# Patient Record
Sex: Female | Born: 1950 | Race: White | Hispanic: No | Marital: Married | Smoking: Former smoker
Health system: Southern US, Community
[De-identification: ages and names within clinical notes are randomized; demographics above are authoritative.]

## PROBLEM LIST (undated history)

## (undated) DIAGNOSIS — K760 Fatty (change of) liver, not elsewhere classified: Secondary | ICD-10-CM

## (undated) DIAGNOSIS — K219 Gastro-esophageal reflux disease without esophagitis: Secondary | ICD-10-CM

## (undated) DIAGNOSIS — E669 Obesity, unspecified: Secondary | ICD-10-CM

## (undated) DIAGNOSIS — R000 Tachycardia, unspecified: Secondary | ICD-10-CM

## (undated) DIAGNOSIS — F419 Anxiety disorder, unspecified: Secondary | ICD-10-CM

## (undated) HISTORY — PX: CERVICAL SPINE SURGERY: SHX589

## (undated) HISTORY — PX: ABDOMINAL HYSTERECTOMY: SHX81

## (undated) HISTORY — PX: ANKLE SURGERY: SHX546

## (undated) HISTORY — PX: HAND SURGERY: SHX662

## (undated) HISTORY — PX: APPENDECTOMY: SHX54

## (undated) HISTORY — PX: CHOLECYSTECTOMY: SHX55

---

## 2015-05-24 ENCOUNTER — Emergency Department (INDEPENDENT_AMBULATORY_CARE_PROVIDER_SITE_OTHER)
Admission: EM | Admit: 2015-05-24 | Discharge: 2015-05-24 | Disposition: A | Discharge status: Home / Self Care | Payer: Managed Care, Other (non HMO) | Source: Home / Self Care | Attending: Family Medicine | Admitting: Family Medicine

## 2015-05-24 ENCOUNTER — Encounter: Payer: Self-pay | Admitting: Emergency Medicine

## 2015-05-24 DIAGNOSIS — H9203 Otalgia, bilateral: Secondary | ICD-10-CM | POA: Diagnosis not present

## 2015-05-24 DIAGNOSIS — R0981 Nasal congestion: Secondary | ICD-10-CM

## 2015-05-24 HISTORY — DX: Fatty (change of) liver, not elsewhere classified: K76.0

## 2015-05-24 NOTE — Discharge Instructions (Signed)
You may use your allergy nasal spray to help keep your sinus clear.  You may also find relief by using over the counter sinus rinses. Ask your pharmacist to show you your different options for sinus rinses.  You may take tylenol and ibuprofen for fever or pain.  Follow up with your primary care provider in 1 week if symptoms not improving, sooner if worsening. See below for further instructions.

## 2015-05-24 NOTE — ED Provider Notes (Signed)
CSN: [id number]     Arrival date & time 05/24/15  1632 History   First MD Initiated Contact with Patient 05/24/15 1642     Chief Complaint  Patient presents with  . Otalgia   (Consider location/radiation/quality/duration/timing/severity/associated sxs/prior Treatment) HPI  The patient is a 64 year old female presenting to urgent care with complaints of bilateral ear pain and pressure that started last night.  Patient also reports decreased hearing in left ear.  Stating it feels "full."  Reports mild nasal congestion.  Ear pain is mildly achy and sore.  She did take Zyrtec this morning thinking it was allergies but no relief.  She has not tried any nasal spray, but does state she has an allergy nasal spray at home.  She has not tried any pain medications prior to arrival.  Denies fevers, chills, nausea, vomiting or diarrhea.  Denies cough.  Denies sick contacts or recent travel.  Past Medical History  Diagnosis Date  . Fatty liver    Past Surgical History  Procedure Laterality Date  . Hand surgery    . Ankle surgery     Family History  Problem Relation Age of Onset  . Cancer Father    History  Substance Use Topics  . Smoking status: Former Smoker    Types: Cigarettes  . Smokeless tobacco: Not on file  . Alcohol Use: No   OB History    No data available     Review of Systems  Constitutional: Negative for fever, chills, appetite change and fatigue.  HENT: Positive for congestion, ear pain (bilateral), hearing loss (Left ear) and sinus pressure. Negative for rhinorrhea, sore throat, trouble swallowing and voice change.   Respiratory: Negative for cough and shortness of breath.   Cardiovascular: Negative for chest pain and palpitations.  Gastrointestinal: Negative for nausea, vomiting, abdominal pain and diarrhea.  Neurological: Positive for dizziness and headaches. Negative for light-headedness.    Allergies  Latex; Lovenox; and Codeine  Home Medications   Prior to  Admission medications   Medication Sig Start Date End Date Taking? Authorizing Provider  ALPRAZolam Prudy Feeler) 0.25 MG tablet Take 0.25 mg by mouth at bedtime as needed for anxiety.   Yes Historical Provider, MD  atenolol (TENORMIN) 50 MG tablet Take 50 mg by mouth daily.   Yes Historical Provider, MD  furosemide (LASIX) 10 MG/ML solution Take by mouth daily.   Yes Historical Provider, MD  omeprazole (PRILOSEC) 10 MG capsule Take 10 mg by mouth daily.   Yes Historical Provider, MD   BP 149/75 mmHg  Pulse 68  Temp(Src) 98.1 F (36.7 C) (Oral)  Wt 208 lb (94.348 kg)  SpO2 96% Physical Exam  Constitutional: She appears well-developed and well-nourished. No distress.  HENT:  Head: Normocephalic and atraumatic.  Right Ear: Hearing, tympanic membrane, external ear and ear canal normal.  Left Ear: No mastoid tenderness. Tympanic membrane is not injected, not perforated, not erythematous, not retracted and not bulging. A middle ear effusion is present.  Nose: Mucosal edema present. Right sinus exhibits no maxillary sinus tenderness and no frontal sinus tenderness. Left sinus exhibits no maxillary sinus tenderness and no frontal sinus tenderness.  Mouth/Throat: Uvula is midline, oropharynx is clear and moist and mucous membranes are normal.  Eyes: Conjunctivae are normal. No scleral icterus.  Neck: Normal range of motion. Neck supple.  Cardiovascular: Normal rate, regular rhythm and normal heart sounds.   Pulmonary/Chest: Effort normal and breath sounds normal. No respiratory distress. She has no wheezes. She has no rales.  She exhibits no tenderness.  Abdominal: Soft. She exhibits no distension and no mass. There is no tenderness. There is no rebound and no guarding.  Musculoskeletal: Normal range of motion.  Neurological: She is alert.  Skin: Skin is warm and dry. She is not diaphoretic.  Nursing note and vitals reviewed.   ED Course  Procedures (including critical care time) Labs Review Labs  Reviewed - No data to display  Imaging Review No results found.   MDM   1. Ear pain, bilateral   2. Sinus congestion    Pt c/o bilateral ear pain for 1 day. No fever, chills, n/v/d. Pt does have mild Left middle ear effusion w/o erythema or bulging of TM.  Will tx conservatively with nasal saline rinses, acetaminophen and ibuprofen for pain. F/u with PCP in 1 week if not improving, sooner if worsening. Patient verbalized understanding and agreement with treatment plan.     Junius Finner, PA-C 05/24/15 1739

## 2015-05-24 NOTE — ED Notes (Signed)
Pt c/o bilateral ear pain and pressure x1 day.

## 2018-11-05 ENCOUNTER — Other Ambulatory Visit: Payer: Self-pay

## 2018-11-05 ENCOUNTER — Emergency Department (HOSPITAL_BASED_OUTPATIENT_CLINIC_OR_DEPARTMENT_OTHER)
Admission: EM | Admit: 2018-11-05 | Discharge: 2018-11-05 | Disposition: A | Discharge status: Home / Self Care | Payer: Worker's Compensation | Attending: Emergency Medicine | Admitting: Emergency Medicine

## 2018-11-05 ENCOUNTER — Emergency Department (HOSPITAL_BASED_OUTPATIENT_CLINIC_OR_DEPARTMENT_OTHER): Payer: Worker's Compensation

## 2018-11-05 ENCOUNTER — Encounter (HOSPITAL_BASED_OUTPATIENT_CLINIC_OR_DEPARTMENT_OTHER): Payer: Self-pay | Admitting: Emergency Medicine

## 2018-11-05 DIAGNOSIS — Y92009 Unspecified place in unspecified non-institutional (private) residence as the place of occurrence of the external cause: Secondary | ICD-10-CM

## 2018-11-05 DIAGNOSIS — Y9301 Activity, walking, marching and hiking: Secondary | ICD-10-CM | POA: Diagnosis not present

## 2018-11-05 DIAGNOSIS — Z87891 Personal history of nicotine dependence: Secondary | ICD-10-CM | POA: Insufficient documentation

## 2018-11-05 DIAGNOSIS — Z91040 Latex allergy status: Secondary | ICD-10-CM | POA: Diagnosis not present

## 2018-11-05 DIAGNOSIS — S0181XA Laceration without foreign body of other part of head, initial encounter: Secondary | ICD-10-CM | POA: Diagnosis not present

## 2018-11-05 DIAGNOSIS — Z79899 Other long term (current) drug therapy: Secondary | ICD-10-CM | POA: Insufficient documentation

## 2018-11-05 DIAGNOSIS — S0081XA Abrasion of other part of head, initial encounter: Secondary | ICD-10-CM | POA: Diagnosis not present

## 2018-11-05 DIAGNOSIS — S0083XA Contusion of other part of head, initial encounter: Secondary | ICD-10-CM | POA: Diagnosis not present

## 2018-11-05 DIAGNOSIS — Y999 Unspecified external cause status: Secondary | ICD-10-CM | POA: Diagnosis not present

## 2018-11-05 DIAGNOSIS — W19XXXA Unspecified fall, initial encounter: Secondary | ICD-10-CM

## 2018-11-05 DIAGNOSIS — S0990XA Unspecified injury of head, initial encounter: Secondary | ICD-10-CM | POA: Diagnosis present

## 2018-11-05 DIAGNOSIS — W0110XA Fall on same level from slipping, tripping and stumbling with subsequent striking against unspecified object, initial encounter: Secondary | ICD-10-CM | POA: Diagnosis not present

## 2018-11-05 DIAGNOSIS — Y929 Unspecified place or not applicable: Secondary | ICD-10-CM | POA: Diagnosis not present

## 2018-11-05 HISTORY — DX: Obesity, unspecified: E66.9

## 2018-11-05 HISTORY — DX: Anxiety disorder, unspecified: F41.9

## 2018-11-05 HISTORY — DX: Gastro-esophageal reflux disease without esophagitis: K21.9

## 2018-11-05 HISTORY — DX: Tachycardia, unspecified: R00.0

## 2018-11-05 MED ORDER — HYDROCODONE-ACETAMINOPHEN 5-325 MG PO TABS
2.0000 | ORAL_TABLET | Freq: Once | ORAL | Status: AC
  Administered 2018-11-05: 2 via ORAL
  Filled 2018-11-05: qty 2

## 2018-11-05 NOTE — ED Triage Notes (Signed)
Pt tripped over a pipe near the floor that she did not see.  Face planted onto floor.  Pain, swelling, bleeding from bridge of nose and left cheek.  Pain in right wrist and thumb and both knees.  Pt is ambulatory.  No LOC.

## 2018-11-05 NOTE — ED Provider Notes (Signed)
[facility] EMERGENCY DEPARTMENT Provider Note   CSN: [id number] Arrival date & time: 11/05/18  0746     History   Chief Complaint Chief Complaint  Patient presents with  . Fall    HPI Allison Sawyer is a 68 y.o. female.  68 year old female with past medical history below who presents with fall and facial injury.  Just prior to arrival, patient was at work when she tripped over a pipe near the floor and fell forward, landing on her face.  She did not lose consciousness.  She was wearing glasses and thinks that some of her injuries on her face are due to the glasses.  She has had some cuts on her face as well as bruising on her right elbow and right knee.  She has been able to walk since the fall.  No vomiting or confusion.  Up-to-date on tetanus vaccination.  No anticoagulant use.  Pain is moderate to severe and constant.  She has a crossbite at baseline, feels that teeth are aligning normally.   The history is provided by the patient.  Fall     Past Medical History:  Diagnosis Date  . Anxiety   . Fatty liver   . GERD (gastroesophageal reflux disease)   . Obesity   . Tachycardia     There are no active problems to display for this patient.   Past Surgical History:  Procedure Laterality Date  . ABDOMINAL HYSTERECTOMY    . ANKLE SURGERY    . APPENDECTOMY    . CERVICAL SPINE SURGERY    . CHOLECYSTECTOMY    . HAND SURGERY       OB History   No obstetric history on file.      Home Medications    Prior to Admission medications   Medication Sig Start Date End Date Taking? Authorizing Provider  ALPRAZolam Prudy Feeler) 0.5 MG tablet Take by mouth. 09/23/18  Yes [provider]  fluconazole (DIFLUCAN) 150 MG tablet Take by mouth. 05/13/18  Yes [provider]  FLUoxetine (PROZAC) 20 MG capsule Take by mouth. 06/30/18  Yes [provider]  fluticasone (FLONASE) 50 MCG/ACT nasal spray Place into the nose. 08/08/16  Yes [provider]  HYDROcodone-acetaminophen (NORCO/VICODIN) 5-325 MG tablet Take by mouth. 09/16/18  Yes [provider]  pantoprazole (PROTONIX) 20 MG tablet Take by mouth. 10/21/18 10/21/19 Yes [provider]  promethazine (PHENERGAN) 25 MG tablet TAKE ONE TABLET BY MOUTH AT ONSET OF HEADACHE, MAY REPEAT ONCE IN 4 HOURS AS NEEDED, MAX 2/DAY OR 3 DOSES/WK 10/24/17  Yes [provider]  tobramycin-dexamethasone (TOBRADEX) ophthalmic solution INSTILL ONE DROP IN THE RIGHT EYE THREE TIMES DAILY 09/17/17  Yes [provider]  atenolol (TENORMIN) 50 MG tablet Take 50 mg by mouth daily.    [provider]  Cholecalciferol (VITAMIN D-3) 125 MCG (5000 UT) TABS Take by mouth.    [provider]  furosemide (LASIX) 10 MG/ML solution Take by mouth daily.    [provider]  omeprazole (PRILOSEC) 10 MG capsule Take 10 mg by mouth daily.    [provider]  pantoprazole (PROTONIX) 20 MG tablet Take 1 tablet (20 mg total) by mouth daily. 10/24/18   [provider]  SYMBICORT 80-4.5 MCG/ACT inhaler  09/22/18   [provider]    Family History Family History  Problem Relation Age of Onset  . Cancer Father     Social History Social History   Tobacco Use  .  Smoking status: Former Smoker    Types: Cigarettes  . Smokeless tobacco: Never Used  Substance Use Topics  . Alcohol use: No  . Drug use: No     Allergies   Latex; Lovenox [enoxaparin sodium]; and Codeine   Review of Systems Review of Systems All other systems reviewed and are negative except that which was mentioned in HPI   Physical Exam Updated Vital Signs BP 130/75   Pulse 69   Temp 98 F (36.7 C) (Oral)   Resp 16   Ht 5\' 5"  (1.651 m)   Wt 88.5 kg   SpO2 95%   BMI 32.45 kg/m   Physical Exam Vitals signs and nursing note reviewed.  Constitutional:      General: She is not in acute distress.    Appearance: She is well-developed.  HENT:      Head:     Comments: Edema and ecchymoses L face involving periorbit, cheek, and chin; abrasions L cheek, top of bridge of nose, and chin; crossbite of teeth with no obvious broken teeth, nares clear    Mouth/Throat:     Mouth: Mucous membranes are moist.     Pharynx: Oropharynx is clear.  Eyes:     Extraocular Movements: Extraocular movements intact.     Conjunctiva/sclera: Conjunctivae normal.     Pupils: Pupils are equal, round, and reactive to light.  Neck:     Musculoskeletal: Normal range of motion and neck supple.  Cardiovascular:     Rate and Rhythm: Normal rate and regular rhythm.     Heart sounds: Normal heart sounds. No murmur.  Pulmonary:     Effort: Pulmonary effort is normal.     Breath sounds: Normal breath sounds.  Chest:     Chest wall: No tenderness.  Abdominal:     General: Bowel sounds are normal. There is no distension.     Palpations: Abdomen is soft.     Tenderness: There is no abdominal tenderness.  Musculoskeletal: Normal range of motion.     Comments: Ecchymoses R elbow and R knee, R knee abrasion, normal ROM all joints  Skin:    General: Skin is warm and dry.  Neurological:     Mental Status: She is alert and oriented to person, place, and time.     Sensory: No sensory deficit.     Comments: Fluent speech  Psychiatric:        Judgment: Judgment normal.      ED Treatments / Results  Labs (all labs ordered are listed, but only abnormal results are displayed) Labs Reviewed - No data to display  EKG None  Radiology Dg Elbow Complete Right  Result Date: 11/05/2018 CLINICAL DATA:  Fall with swelling at the lateral epicondyle of the elbow. EXAM: RIGHT ELBOW - COMPLETE 3+ VIEW COMPARISON:  None. FINDINGS: There is no evidence of fracture, dislocation, or joint effusion. There is no evidence of arthropathy or other focal bone abnormality. Soft tissues are unremarkable. IMPRESSION: Negative. Electronically Signed   By: Marnee Spring M.D.   On:  11/05/2018 10:20   Ct Head Wo Contrast  Result Date: 11/05/2018 CLINICAL DATA:  The patient tripped over a pipe today resulting in a fall and blow to the face. Initial encounter. EXAM: CT HEAD WITHOUT CONTRAST CT MAXILLOFACIAL WITHOUT CONTRAST CT CERVICAL SPINE WITHOUT CONTRAST TECHNIQUE: Multidetector CT imaging of the head, cervical spine, and maxillofacial structures were performed using the standard protocol without intravenous contrast. Multiplanar CT image reconstructions of the cervical spine  and maxillofacial structures were also generated. COMPARISON:  None. FINDINGS: CT HEAD FINDINGS Brain: No evidence of acute infarction, hemorrhage, hydrocephalus, extra-axial collection or mass lesion/mass effect. There is some cortical atrophy and chronic microvascular ischemic change. Vascular: No hyperdense vessel or unexpected calcification. Skull: Intact.  No focal lesion. Other: None. CT MAXILLOFACIAL FINDINGS Osseous: No fracture or mandibular dislocation. No destructive process. There is some degenerative change about the left TMJ. Orbits: No fracture. The patient is status post left lens extraction. The right lens is located. Globes are intact. Orbital fat is clear. Optic nerves and extraocular muscles appear normal. Sinuses: Clear. Soft tissues: Soft tissue contusion is seen over the left maxilla. CT CERVICAL SPINE FINDINGS Alignment: Normal. Skull base and vertebrae: No acute fracture. No primary bone lesion or focal pathologic process. The patient is status post C5-6 fusion. There is autologous fusion across the C6-7 disc interspace. Soft tissues and spinal canal: No prevertebral fluid or swelling. No visible canal hematoma. Disc levels: Endplate spurring is seen at C6-7. Disc bulging with calcification of the annulus is seen at C3-4 and C4-5. Upper chest: Negative. Other: None. IMPRESSION: Soft tissue contusion over the left side of the face without underlying fracture. No acute abnormality head or  cervical spine. Mild cortical atrophy and chronic microvascular ischemic change. Mild appearing cervical spondylosis. The patient is status post C5-6 fusion Electronically Signed   By: Drusilla Kanner M.D.   On: 11/05/2018 10:23   Ct Cervical Spine Wo Contrast  Result Date: 11/05/2018 CLINICAL DATA:  The patient tripped over a pipe today resulting in a fall and blow to the face. Initial encounter. EXAM: CT HEAD WITHOUT CONTRAST CT MAXILLOFACIAL WITHOUT CONTRAST CT CERVICAL SPINE WITHOUT CONTRAST TECHNIQUE: Multidetector CT imaging of the head, cervical spine, and maxillofacial structures were performed using the standard protocol without intravenous contrast. Multiplanar CT image reconstructions of the cervical spine and maxillofacial structures were also generated. COMPARISON:  None. FINDINGS: CT HEAD FINDINGS Brain: No evidence of acute infarction, hemorrhage, hydrocephalus, extra-axial collection or mass lesion/mass effect. There is some cortical atrophy and chronic microvascular ischemic change. Vascular: No hyperdense vessel or unexpected calcification. Skull: Intact.  No focal lesion. Other: None. CT MAXILLOFACIAL FINDINGS Osseous: No fracture or mandibular dislocation. No destructive process. There is some degenerative change about the left TMJ. Orbits: No fracture. The patient is status post left lens extraction. The right lens is located. Globes are intact. Orbital fat is clear. Optic nerves and extraocular muscles appear normal. Sinuses: Clear. Soft tissues: Soft tissue contusion is seen over the left maxilla. CT CERVICAL SPINE FINDINGS Alignment: Normal. Skull base and vertebrae: No acute fracture. No primary bone lesion or focal pathologic process. The patient is status post C5-6 fusion. There is autologous fusion across the C6-7 disc interspace. Soft tissues and spinal canal: No prevertebral fluid or swelling. No visible canal hematoma. Disc levels: Endplate spurring is seen at C6-7. Disc bulging  with calcification of the annulus is seen at C3-4 and C4-5. Upper chest: Negative. Other: None. IMPRESSION: Soft tissue contusion over the left side of the face without underlying fracture. No acute abnormality head or cervical spine. Mild cortical atrophy and chronic microvascular ischemic change. Mild appearing cervical spondylosis. The patient is status post C5-6 fusion Electronically Signed   By: Drusilla Kanner M.D.   On: 11/05/2018 10:23   Dg Knee Complete 4 Views Right  Result Date: 11/05/2018 CLINICAL DATA:  Fall. Right anterior knee abrasion with pain superior to the patella. Initial encounter. EXAM:  RIGHT KNEE - COMPLETE 4+ VIEW COMPARISON:  None. FINDINGS: No acute fracture, dislocation, or knee joint effusion is identified. There is medial and lateral compartment chondrocalcinosis without significant associated joint space narrowing. IMPRESSION: No acute osseous abnormality. Electronically Signed   By: Sebastian AcheAllen  Grady M.D.   On: 11/05/2018 10:20   Ct Maxillofacial Wo Cm  Result Date: 11/05/2018 CLINICAL DATA:  The patient tripped over a pipe today resulting in a fall and blow to the face. Initial encounter. EXAM: CT HEAD WITHOUT CONTRAST CT MAXILLOFACIAL WITHOUT CONTRAST CT CERVICAL SPINE WITHOUT CONTRAST TECHNIQUE: Multidetector CT imaging of the head, cervical spine, and maxillofacial structures were performed using the standard protocol without intravenous contrast. Multiplanar CT image reconstructions of the cervical spine and maxillofacial structures were also generated. COMPARISON:  None. FINDINGS: CT HEAD FINDINGS Brain: No evidence of acute infarction, hemorrhage, hydrocephalus, extra-axial collection or mass lesion/mass effect. There is some cortical atrophy and chronic microvascular ischemic change. Vascular: No hyperdense vessel or unexpected calcification. Skull: Intact.  No focal lesion. Other: None. CT MAXILLOFACIAL FINDINGS Osseous: No fracture or mandibular dislocation. No  destructive process. There is some degenerative change about the left TMJ. Orbits: No fracture. The patient is status post left lens extraction. The right lens is located. Globes are intact. Orbital fat is clear. Optic nerves and extraocular muscles appear normal. Sinuses: Clear. Soft tissues: Soft tissue contusion is seen over the left maxilla. CT CERVICAL SPINE FINDINGS Alignment: Normal. Skull base and vertebrae: No acute fracture. No primary bone lesion or focal pathologic process. The patient is status post C5-6 fusion. There is autologous fusion across the C6-7 disc interspace. Soft tissues and spinal canal: No prevertebral fluid or swelling. No visible canal hematoma. Disc levels: Endplate spurring is seen at C6-7. Disc bulging with calcification of the annulus is seen at C3-4 and C4-5. Upper chest: Negative. Other: None. IMPRESSION: Soft tissue contusion over the left side of the face without underlying fracture. No acute abnormality head or cervical spine. Mild cortical atrophy and chronic microvascular ischemic change. Mild appearing cervical spondylosis. The patient is status post C5-6 fusion Electronically Signed   By: Drusilla Kannerhomas  Dalessio M.D.   On: 11/05/2018 10:23    Procedures .Marland Kitchen.Laceration Repair Date/Time: 11/05/2018 11:12 AM Performed by: Laurence SpatesLittle, Deklin Bieler Morgan, MD Authorized by: Laurence SpatesLittle, Kaleiah Kutzer Morgan, MD   Consent:    Consent obtained:  Verbal   Consent given by:  Patient   Risks discussed:  Poor cosmetic result and need for additional repair   Alternatives discussed:  No treatment Anesthesia (see MAR for exact dosages):    Anesthesia method:  None Laceration details:    Location:  Face   Face location:  Chin   Length (cm):  0.3 Repair type:    Repair type:  Simple Treatment:    Area cleansed with:  Shur-Clens   Amount of cleaning:  Standard Skin repair:    Repair method:  Steri-Strips   Number of Steri-Strips:  3 Approximation:    Approximation:  Close Post-procedure  details:    Dressing:  Open (no dressing)   Patient tolerance of procedure:  Tolerated well, no immediate complications   (including critical care time)  Medications Ordered in ED Medications  HYDROcodone-acetaminophen (NORCO/VICODIN) 5-325 MG per tablet 2 tablet (2 tablets Oral Given 11/05/18 0939)     Initial Impression / Assessment and Plan / ED Course  I have reviewed the triage vital signs and the nursing notes.  Pertinent labs & imaging results that were available during my  care of the patient were reviewed by me and considered in my medical decision making (see chart for details).     Neuro intact on exam w/ facial injuries noted as above. CT head, c-spine, and face negative for fractures or bleed. XR of extremities negative acute. Cleaned wounds, no deep lacerations requiring suturing. Applied steristrips to chin and bacitracin to abrasions.  Discussed supportive measures including ice, elevation of head of bed, and antibiotic ointment to wounds until they heal.  Recommended soft diet.  Extensively reviewed return precautions and she voiced understanding.  Final Clinical Impressions(s) / ED Diagnoses   Final diagnoses:  Contusion of face, initial encounter  Fall in home, initial encounter  Abrasion of face, initial encounter    ED Discharge Orders    None       Camille Dragan, Ambrose Finland, MD 11/05/18 1113

## 2018-11-05 NOTE — ED Notes (Signed)
ED Provider at bedside. 

## 2019-10-29 IMAGING — CR DG KNEE COMPLETE 4+V*R*
4 series · 4 of 4 positions shown · non-contrast
Comparison: None.

CLINICAL DATA: Fall. Right anterior knee abrasion with pain
superior to the patella. Initial encounter.

EXAM:
RIGHT KNEE - COMPLETE 4+ VIEW

[t knee ap right]
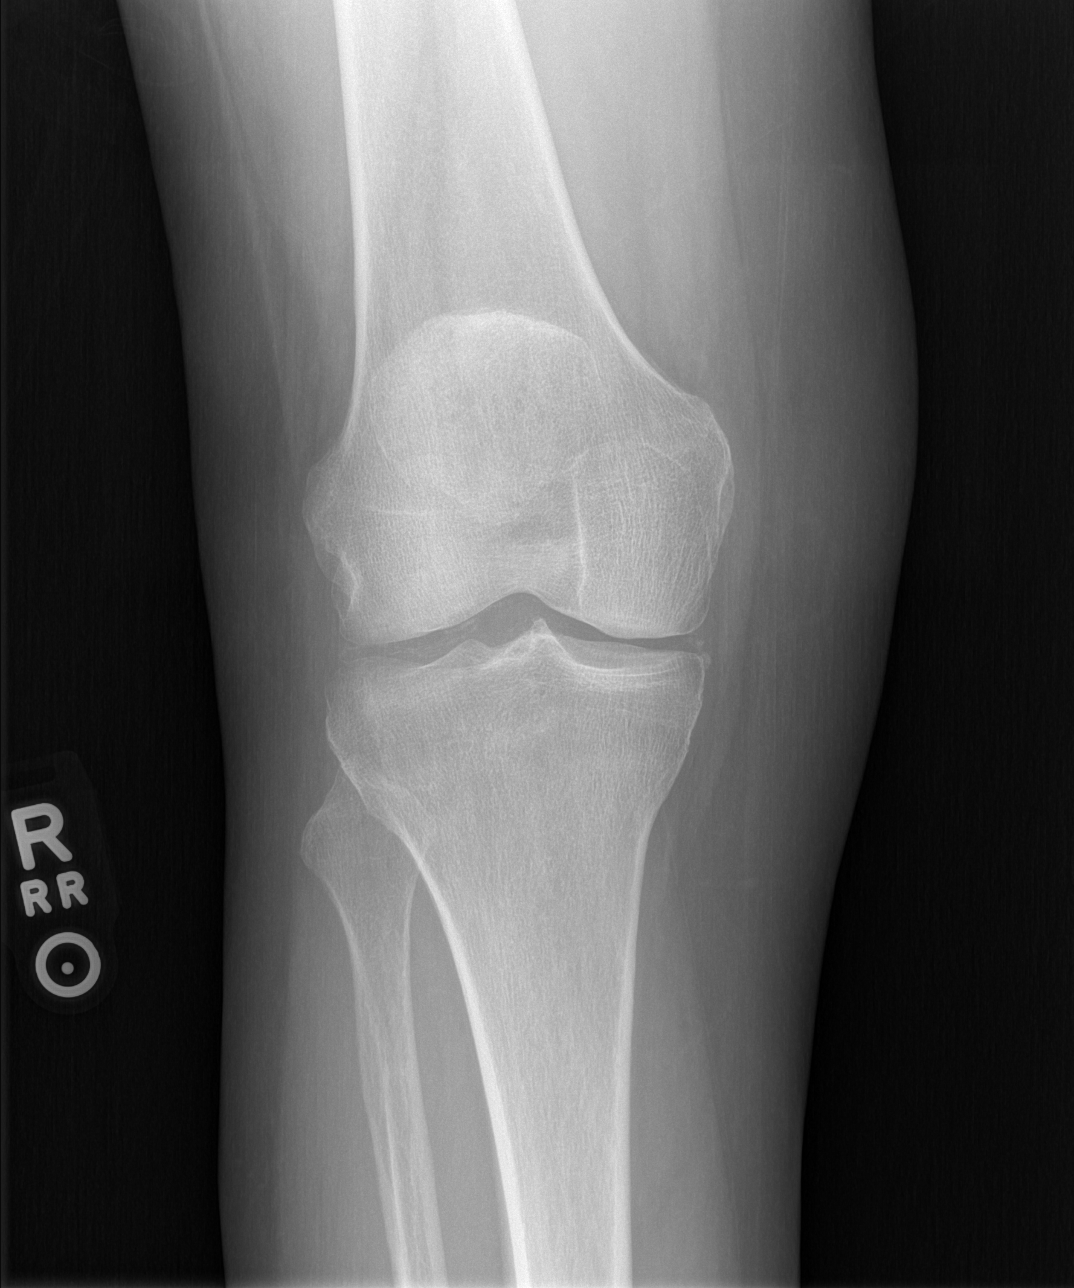

[t knee oblique right (1 of 2)]
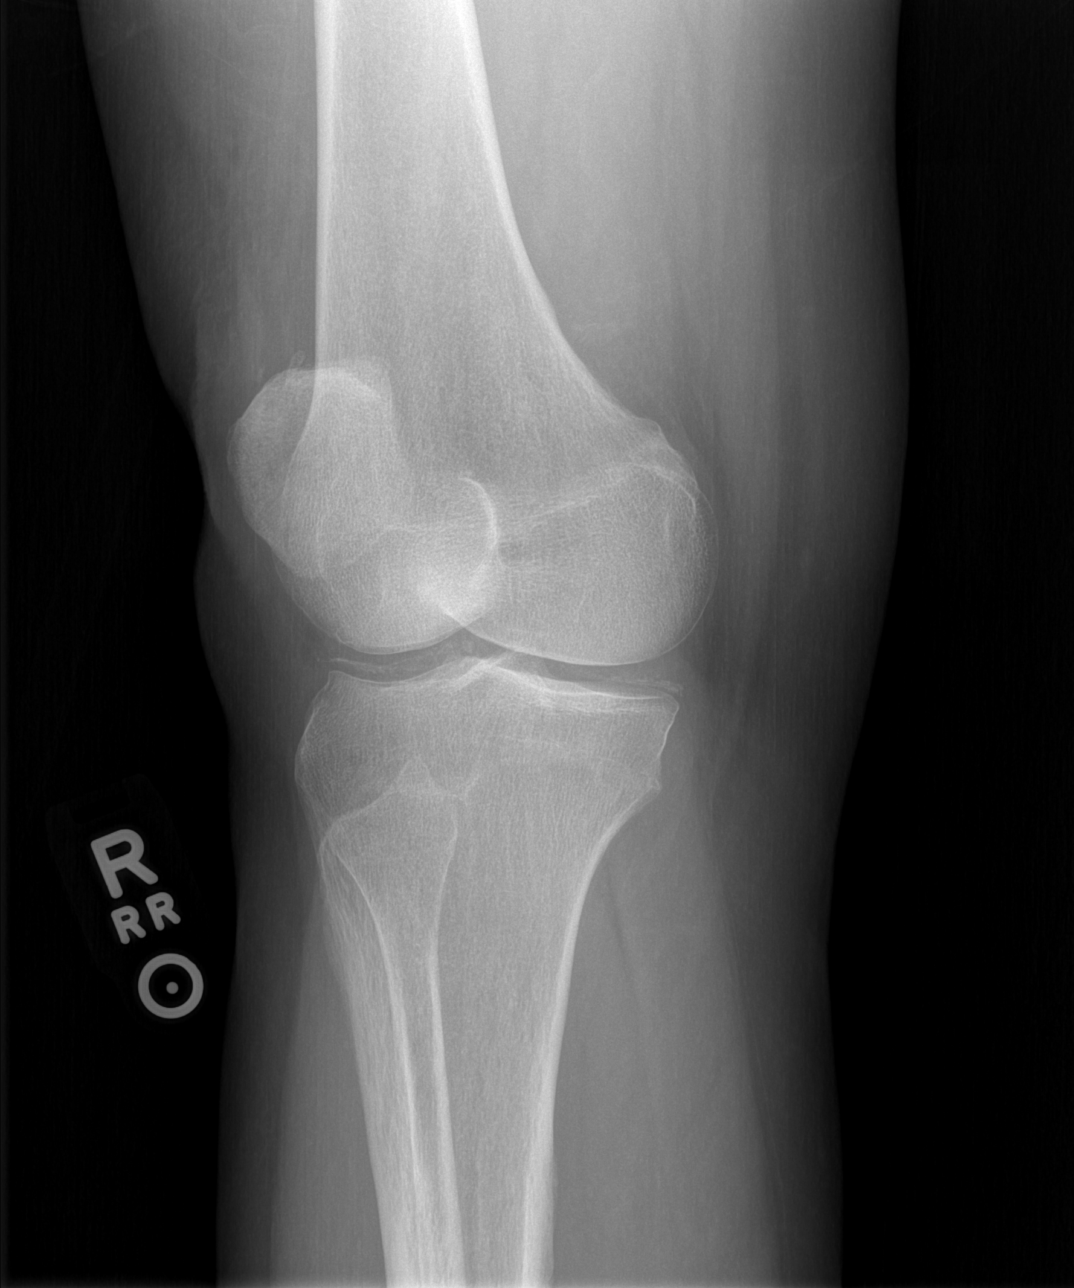

[t knee oblique right (2 of 2)]
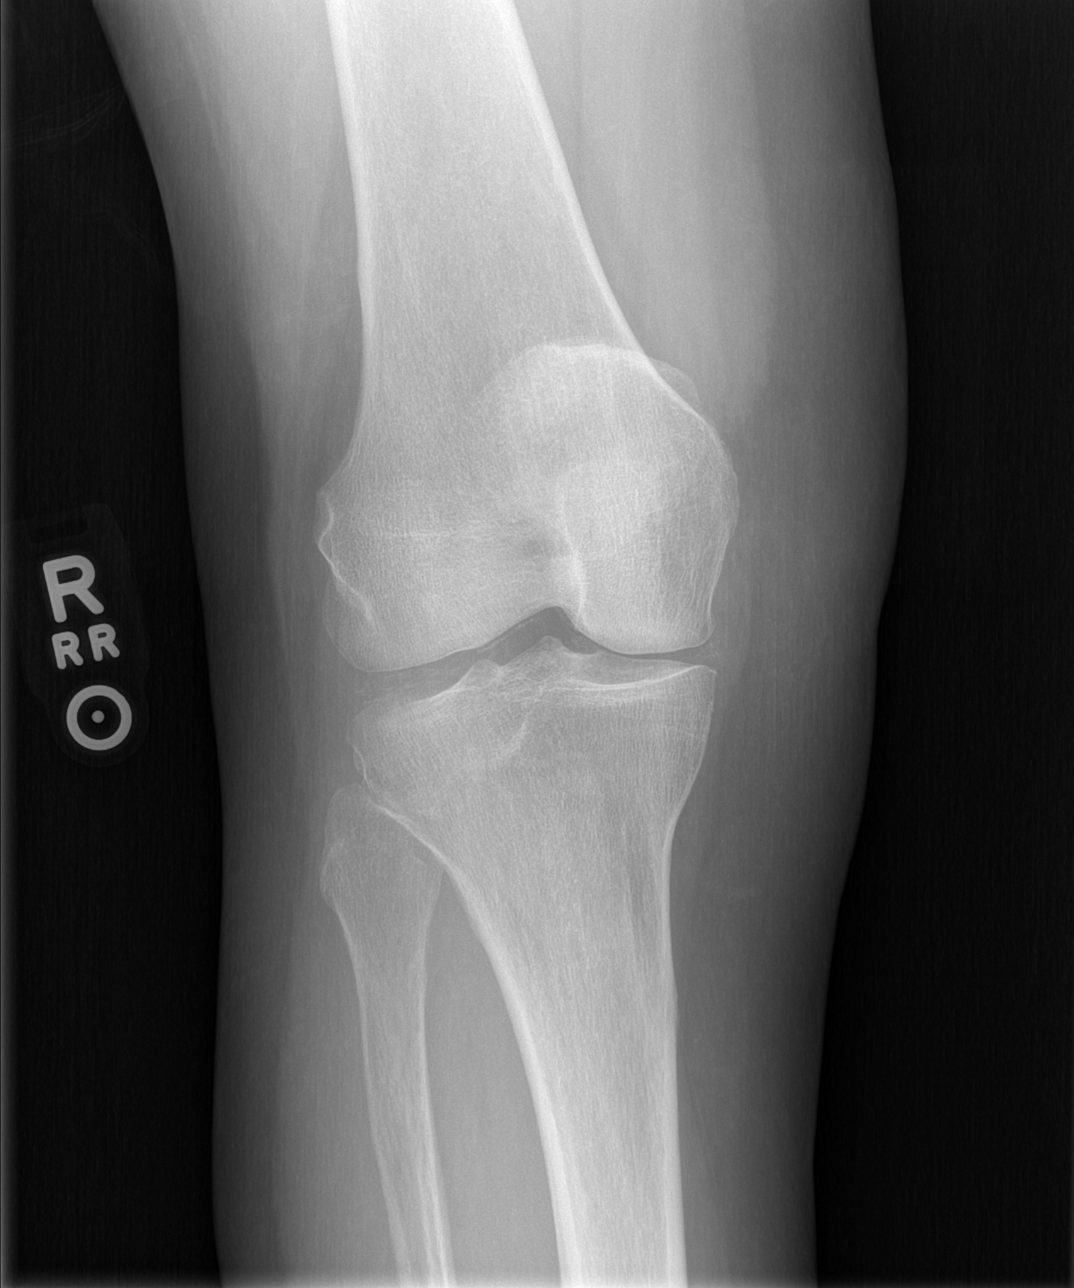

[t knee lat right]
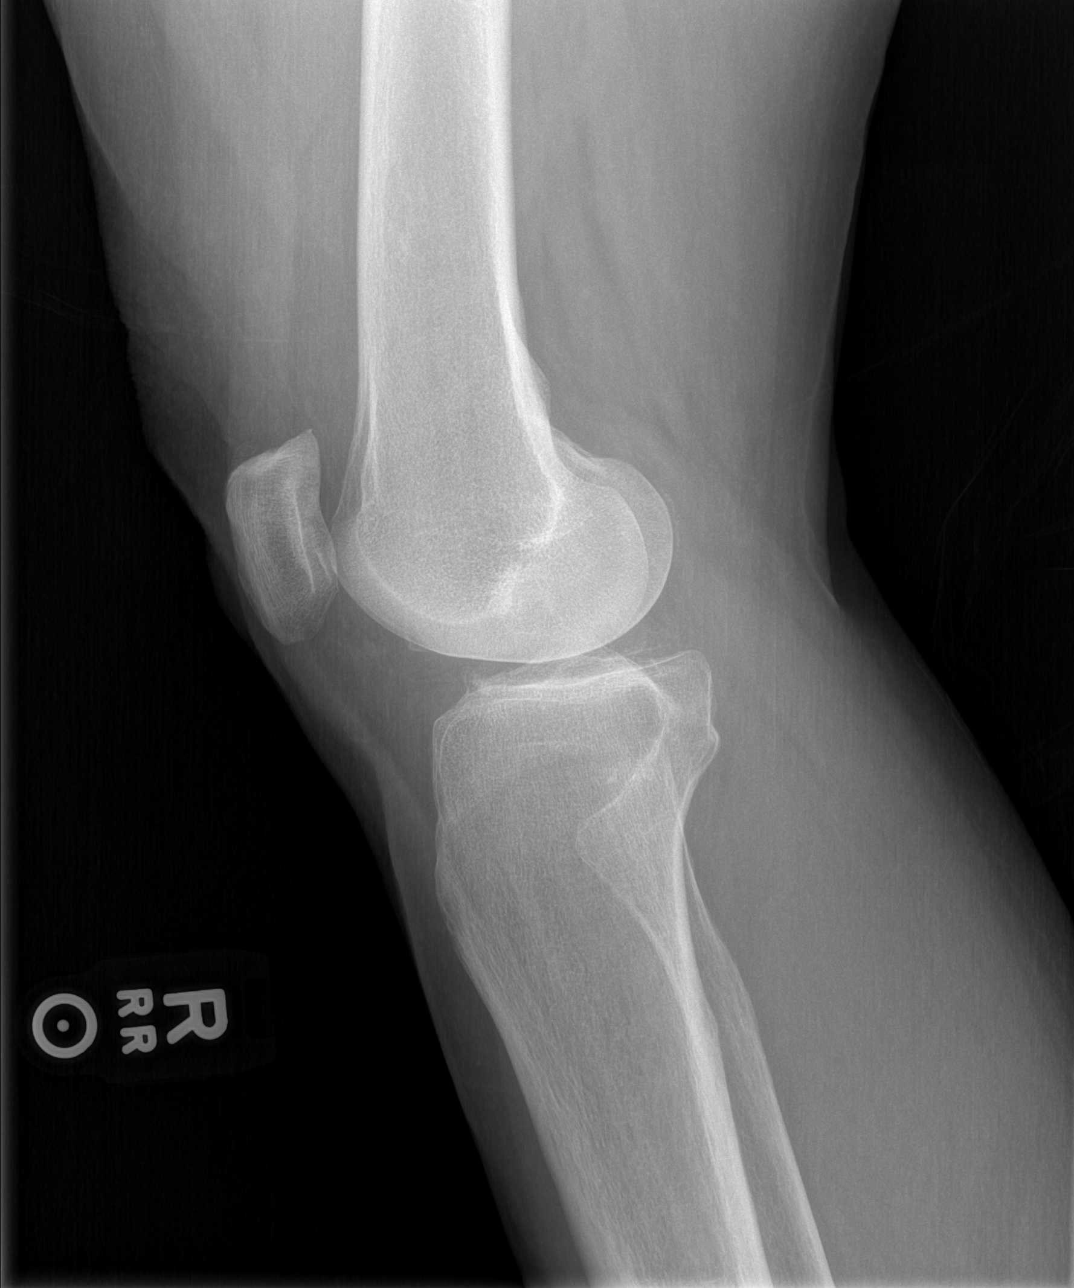

[4 of 4 positions shown; findings below may reference images not displayed]

FINDINGS: No acute fracture, dislocation, or knee joint effusion is
identified. There is medial and lateral compartment
chondrocalcinosis without significant associated joint space
narrowing.
IMPRESSION: No acute osseous abnormality.
